# Patient Record
Sex: Male | Born: 1985 | Race: White | Hispanic: No | Marital: Married | Smoking: Light tobacco smoker
Health system: Southern US, Community
[De-identification: ages and names within clinical notes are randomized; demographics above are authoritative.]

## PROBLEM LIST (undated history)

## (undated) DIAGNOSIS — N433 Hydrocele, unspecified: Secondary | ICD-10-CM

## (undated) DIAGNOSIS — F909 Attention-deficit hyperactivity disorder, unspecified type: Secondary | ICD-10-CM

---
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
## 2016-01-31 ENCOUNTER — Ambulatory Visit (HOSPITAL_BASED_OUTPATIENT_CLINIC_OR_DEPARTMENT_OTHER)
Admission: RE | Admit: 2016-01-31 | Discharge: 2016-01-31 | Disposition: A | Discharge status: Home / Self Care | Payer: 59 | Source: Ambulatory Visit | Attending: Emergency Medicine | Admitting: Emergency Medicine

## 2016-01-31 ENCOUNTER — Emergency Department (HOSPITAL_BASED_OUTPATIENT_CLINIC_OR_DEPARTMENT_OTHER)
Admission: EM | Admit: 2016-01-31 | Discharge: 2016-01-31 | Disposition: A | Discharge status: Home / Self Care | Payer: 59 | Source: Home / Self Care | Attending: Emergency Medicine | Admitting: Emergency Medicine

## 2016-01-31 ENCOUNTER — Other Ambulatory Visit (HOSPITAL_BASED_OUTPATIENT_CLINIC_OR_DEPARTMENT_OTHER): Payer: Self-pay | Admitting: Emergency Medicine

## 2016-01-31 ENCOUNTER — Encounter (HOSPITAL_BASED_OUTPATIENT_CLINIC_OR_DEPARTMENT_OTHER): Payer: Self-pay | Admitting: *Deleted

## 2016-01-31 DIAGNOSIS — N50811 Right testicular pain: Secondary | ICD-10-CM

## 2016-01-31 DIAGNOSIS — Z79899 Other long term (current) drug therapy: Secondary | ICD-10-CM | POA: Diagnosis not present

## 2016-01-31 DIAGNOSIS — Z720 Tobacco use: Secondary | ICD-10-CM | POA: Insufficient documentation

## 2016-01-31 DIAGNOSIS — N451 Epididymitis: Secondary | ICD-10-CM | POA: Diagnosis not present

## 2016-01-31 HISTORY — DX: Hydrocele, unspecified: N43.3

## 2016-01-31 LAB — URINALYSIS, ROUTINE W REFLEX MICROSCOPIC
Bilirubin Urine: NEGATIVE
Glucose, UA: NEGATIVE mg/dL
Hgb urine dipstick: NEGATIVE
Ketones, ur: NEGATIVE mg/dL
Leukocytes, UA: NEGATIVE
Nitrite: NEGATIVE
Protein, ur: 100 mg/dL — AB
Specific Gravity, Urine: 1.025 (ref 1.005–1.030)
pH: 7.5 (ref 5.0–8.0)

## 2016-01-31 LAB — URINE MICROSCOPIC-ADD ON
RBC / HPF: NONE SEEN RBC/hpf (ref 0–5)
Squamous Epithelial / HPF: NONE SEEN
WBC, UA: NONE SEEN WBC/hpf (ref 0–5)

## 2016-01-31 MED ORDER — LEVOFLOXACIN 500 MG PO TABS: 500.0000 mg | ORAL_TABLET | Freq: Every day | ORAL | Status: DC

## 2016-01-31 NOTE — Discharge Instructions (Signed)
Epididymitis °Epididymitis is swelling (inflammation) of the epididymis. The epididymis is a cord-like structure that is located along the top and back part of the testicle. It collects and stores sperm from the testicle. °This condition can also cause pain and swelling of the testicle and scrotum. Symptoms usually start suddenly (acute epididymitis). Sometimes epididymitis starts gradually and lasts for a while (chronic epididymitis). This type may be harder to treat. °CAUSES °In men 35 and younger, this condition is usually caused by a bacterial infection or sexually transmitted disease (STD), such as: °· Gonorrhea. °· Chlamydia.   °In men 35 and older who do not have anal sex, this condition is usually caused by bacteria from a blockage or abnormalities in the urinary system. These can result from: °· Having a tube placed into the bladder (urinary catheter). °· Having an enlarged or inflamed prostate gland. °· Having recent urinary tract surgery. °In men who have a condition that weakens the body's defense system (immune system), such as HIV, this condition can be caused by:  °· Other bacteria, including tuberculosis and syphilis. °· Viruses. °· Fungi. °Sometimes this condition occurs without infection. That may happen if urine flows backward into the epididymis after heavy lifting or straining. °RISK FACTORS °This condition is more likely to develop in men: °· Who have unprotected sex with more than one partner. °· Who have anal sex.   °· Who have recently had surgery.   °· Who have a urinary catheter. °· Who have urinary problems. °· Who have a suppressed immune system.   °SYMPTOMS  °This condition usually begins suddenly with chills, fever, and pain behind the scrotum and in the testicle. Other symptoms include:  °· Swelling of the scrotum, testicle, or both. °· Pain when ejaculating or urinating. °· Pain in the back or belly. °· Nausea. °· Itching and discharge from the penis. °· Frequent need to pass  urine. °· Redness and tenderness of the scrotum. °DIAGNOSIS °Your health care provider can diagnose this condition based on your symptoms and medical history. Your health care provider will also do a physical exam to ask about your symptoms and check your scrotum and testicle for swelling, pain, and redness. You may also have other tests, including:   °· Examination of discharge from the penis. °· Urine tests for infections, such as STDs.   °Your health care provider may test you for other STDs, including HIV.  °TREATMENT °Treatment for this condition depends on the cause. If your condition is caused by a bacterial infection, oral antibiotic medicine may be prescribed. If the bacterial infection has spread to your blood, you may need to receive IV antibiotics. Nonbacterial epididymitis is treated with home care that includes bed rest and elevation of the scrotum. °Surgery may be needed to treat: °· Bacterial epididymitis that causes pus to build up in the scrotum (abscess). °· Chronic epididymitis that has not responded to other treatments. °HOME CARE INSTRUCTIONS °Medicines  °· Take over-the-counter and prescription medicines only as told by your health care provider.   °· If you were prescribed an antibiotic medicine, take it as told by your health care provider. Do not stop taking the antibiotic even if your condition improves. °Sexual Activity  °· If your epididymitis was caused by an STD, avoid sexual activity until your treatment is complete. °· Inform your sexual partner or partners if you test positive for an STD. They may need to be treated. Do not engage in sexual activity with your partner or partners until their treatment is completed. °General Instructions  °· Return to your normal activities as told   by your health care provider. Ask your health care provider what activities are safe for you.  Keep your scrotum elevated and supported while resting. Ask your health care provider if you should wear a  scrotal support, such as a jockstrap. Wear it as told by your health care provider.  If directed, apply ice to the affected area:   Put ice in a plastic bag.  Place a towel between your skin and the bag.  Leave the ice on for 20 minutes, 2-3 times per day.  Try taking a sitz bath to help with discomfort. This is a warm water bath that is taken while you are sitting down. The water should only come up to your hips and should cover your buttocks. Do this 3-4 times per day or as told by your health care provider.  Keep all follow-up visits as told by your health care provider. This is important. SEEK MEDICAL CARE IF:   You have a fever.   Your pain medicine is not helping.   Your pain is getting worse.   Your symptoms do not improve within three days.   This information is not intended to replace advice given to you by your health care provider. Make sure you discuss any questions you have with your health care provider.   Document Released: 07/29/2000 Document Revised: 04/22/2015 Document Reviewed: 12/17/2014 Elsevier Interactive Patient Education 2016 [employer]  Epididymitis Epididymitis is swelling (inflammation) of the epididymis. The epididymis is a cord-like structure that is located along the top and back part of the testicle. It collects and stores sperm from the testicle. This condition can also cause pain and swelling of the testicle and scrotum. Symptoms usually start suddenly (acute epididymitis). Sometimes epididymitis starts gradually and lasts for a while (chronic epididymitis). This type may be harder to treat. CAUSES In men 5635 and younger, this condition is usually caused by a bacterial infection or sexually transmitted disease (STD), such as:  Gonorrhea.  Chlamydia.  In men 4635 and older who do not have anal sex, this condition is usually caused by bacteria from a blockage or abnormalities in the urinary system. These can result from:  Having a tube  placed into the bladder (urinary catheter).  Having an enlarged or inflamed prostate gland.  Having recent urinary tract surgery. In men who have a condition that weakens the body's defense system (immune system), such as HIV, this condition can be caused by:   Other bacteria, including tuberculosis and syphilis.  Viruses.  Fungi. Sometimes this condition occurs without infection. That may happen if urine flows backward into the epididymis after heavy lifting or straining. RISK FACTORS This condition is more likely to develop in men:  Who have unprotected sex with more than one partner.  Who have anal sex.   Who have recently had surgery.   Who have a urinary catheter.  Who have urinary problems.  Who have a suppressed immune system. SYMPTOMS  This condition usually begins suddenly with chills, fever, and pain behind the scrotum and in the testicle. Other symptoms include:   Swelling of the scrotum, testicle, or both.  Pain whenejaculatingor urinating.  Pain in the back or belly.  Nausea.  Itching and discharge from the penis.  Frequent need to pass urine.  Redness and tenderness of the scrotum. DIAGNOSIS Your health care provider can diagnose this condition based on your symptoms and medical history. Your health care provider will also do a physical exam to ask about your symptoms and  check your scrotum and testicle for swelling, pain, and redness. You may also have other tests, including:   Examination of discharge from the penis.  Urine tests for infections, such as STDs.  Your health care provider may test you for other STDs, including HIV. TREATMENT Treatment for this condition depends on the cause. If your condition is caused by a bacterial infection, oral antibiotic medicine may be prescribed. If the bacterial infection has spread to your blood, you may need to receive IV antibiotics. Nonbacterial epididymitis is treated with home care that includes  bed rest and elevation of the scrotum. Surgery may be needed to treat:  Bacterial epididymitis that causes pus to build up in the scrotum (abscess).  Chronic epididymitis that has not responded to other treatments. HOME CARE INSTRUCTIONS Medicines  Take over-the-counter and prescription medicines only as told by your health care provider.   If you were prescribed an antibiotic medicine, take it as told by your health care provider. Do not stop taking the antibiotic even if your condition improves. Sexual Activity  If your epididymitis was caused by an STD, avoid sexual activity until your treatment is complete.  Inform your sexual partner or partners if you test positive for an STD. They may need to be treated.Do not engage in sexual activity with your partner or partners until their treatment is completed. General Instructions  Return to your normal activities as told by your health care provider. Ask your health care provider what activities are safe for you.  Keep your scrotum elevated and supported while resting. Ask your health care provider if you should wear a scrotal support, such as a jockstrap. Wear it as told by your health care provider.  If directed, apply ice to the affected area:   Put ice in a plastic bag.  Place a towel between your skin and the bag.  Leave the ice on for 20 minutes, 2-3 times per day.  Try taking a sitz bath to help with discomfort. This is a warm water bath that is taken while you are sitting down. The water should only come up to your hips and should cover your buttocks. Do this 3-4 times per day or as told by your health care provider.  Keep all follow-up visits as told by your health care provider. This is important. SEEK MEDICAL CARE IF:   You have a fever.   Your pain medicine is not helping.   Your pain is getting worse.   Your symptoms do not improve within three days.   This information is not intended to replace advice  given to you by your health care provider. Make sure you discuss any questions you have with your health care provider.   Document Released: 07/29/2000 Document Revised: 04/22/2015 Document Reviewed: 12/17/2014 Elsevier Interactive Patient Education [employer]

## 2016-01-31 NOTE — ED Notes (Signed)
MD with pt  

## 2016-01-31 NOTE — ED Notes (Signed)
Pt c/o aching pain to right testicle times one week. States pain comes and goes. States that he feels like his right testicle is more swollen. States he does heavy lifting at work. Denies any fevers. Denies any urinary frequency or penile d/c.

## 2016-01-31 NOTE — ED Notes (Signed)
Report given to next shift.

## 2016-01-31 NOTE — ED Provider Notes (Signed)
CSN: [id number]     Arrival date & time 01/31/16  [id number] History   First MD Initiated Contact with Patient 01/31/16 1002     Chief Complaint  Patient presents with  . Testicle Pain     (Consider location/radiation/quality/duration/timing/severity/associated sxs/prior Treatment) HPI Comments: Patient presents with pain to his right testicle. He states it started about one week ago. It waxes and wanes in intensity but overall he feels like it's better. He feels like it's a little bit more swollen than normal. He denies any abdominal pain. No nausea or vomiting. No urinary symptoms. He is sexually active with his wife. He denies any increased risk for STDs. He currently is using condoms. He has a prior history of a hydrocele repair as a child.   Past Medical History  Diagnosis Date  . Hydrocele    History reviewed. No pertinent past surgical history. No family history on file. Social History  Substance Use Topics  . Smoking status: Light Tobacco Smoker  . Smokeless tobacco: None  . Alcohol Use: Yes     Comment: occasional     Review of Systems  Constitutional: Negative for fever, chills, diaphoresis and fatigue.  HENT: Negative for congestion, rhinorrhea and sneezing.   Eyes: Negative.   Respiratory: Negative for cough, chest tightness and shortness of breath.   Cardiovascular: Negative for chest pain and leg swelling.  Gastrointestinal: Negative for nausea, vomiting, abdominal pain, diarrhea and blood in stool.  Genitourinary: Positive for scrotal swelling and testicular pain. Negative for frequency, hematuria, flank pain, discharge, difficulty urinating and penile pain.  Musculoskeletal: Negative for back pain and arthralgias.  Skin: Negative for rash.  Neurological: Negative for dizziness, speech difficulty, weakness, numbness and headaches.      Allergies  Amoxicillin; Ceclor; and Pertussis vaccines  Home Medications   Prior to Admission medications   Medication Sig  Start Date End Date Taking? Authorizing Provider  amphetamine-dextroamphetamine (ADDERALL) 30 MG tablet Take by mouth daily. 60mg  daily   Yes Historical Provider, MD  levofloxacin (LEVAQUIN) 500 MG tablet Take 1 tablet (500 mg total) by mouth daily. 01/31/16   Rolan BuccoMelanie Natash Berman, MD   BP 127/79 mmHg  Pulse 108  Resp 18  Ht 5\' 10"  (1.778 m)  Wt 165 lb (74.844 kg)  BMI 23.68 kg/m2  SpO2 100% Physical Exam  Constitutional: He is oriented to person, place, and time. He appears well-developed and well-nourished.  HENT:  Head: Normocephalic and atraumatic.  Eyes: Pupils are equal, round, and reactive to light.  Neck: Normal range of motion. Neck supple.  Cardiovascular: Normal rate, regular rhythm and normal heart sounds.   Pulmonary/Chest: Effort normal and breath sounds normal. No respiratory distress. He has no wheezes. He has no rales. He exhibits no tenderness.  Abdominal: Soft. Bowel sounds are normal. There is no tenderness. There is no rebound and no guarding.  Genitourinary: Penis normal.  Mild tenderness to his right testicle. No pain to the left testicle. No abnormal lie.  No palpable hernia.  Musculoskeletal: Normal range of motion. He exhibits no edema.  Lymphadenopathy:    He has no cervical adenopathy.  Neurological: He is alert and oriented to person, place, and time.  Skin: Skin is warm and dry. No rash noted.  Psychiatric: He has a normal mood and affect.    ED Course  Procedures (including critical care time) Labs Review Labs Reviewed  URINALYSIS, ROUTINE W REFLEX MICROSCOPIC ([facility]) - Abnormal; Notable for the following:    APPearance TURBID (*)  Protein, ur 100 (*)    All other components within normal limits  URINE MICROSCOPIC-ADD ON - Abnormal; Notable for the following:    Bacteria, UA MANY (*)    All other components within normal limits  GC/CHLAMYDIA PROBE AMP ([place]) NOT AT [facility]    Imaging Review No results found. I have personally reviewed  and evaluated these images and lab results as part of my medical decision-making.   EKG Interpretation None      MDM   Final diagnoses:  Epididymitis    Patient has an allergy to amoxicillin and cephalosporins. He was started on Levaquin as he seems to be low risk for STDs. However GC and Chlamydia is pending. Ultrasound will be here in 2 hours. A testicle ultrasound was ordered for this afternoon and he will return to have the ultrasound. Given his symptoms have been going on for 5 days I feel it's appropriate to wait until this afternoon to get the ultrasound.    Rolan Bucco, MD 01/31/16 1125

## 2016-02-01 LAB — GC/CHLAMYDIA PROBE AMP (~~LOC~~) NOT AT ARMC
Chlamydia: NEGATIVE
Neisseria Gonorrhea: NEGATIVE

## 2017-12-08 ENCOUNTER — Emergency Department (HOSPITAL_BASED_OUTPATIENT_CLINIC_OR_DEPARTMENT_OTHER)
Admission: EM | Admit: 2017-12-08 | Discharge: 2017-12-08 | Disposition: A | Discharge status: Home / Self Care | Payer: 59 | Attending: Emergency Medicine | Admitting: Emergency Medicine

## 2017-12-08 ENCOUNTER — Other Ambulatory Visit: Payer: Self-pay

## 2017-12-08 ENCOUNTER — Emergency Department (HOSPITAL_BASED_OUTPATIENT_CLINIC_OR_DEPARTMENT_OTHER): Payer: 59

## 2017-12-08 ENCOUNTER — Encounter (HOSPITAL_BASED_OUTPATIENT_CLINIC_OR_DEPARTMENT_OTHER): Payer: Self-pay | Admitting: Emergency Medicine

## 2017-12-08 DIAGNOSIS — N50811 Right testicular pain: Secondary | ICD-10-CM | POA: Diagnosis present

## 2017-12-08 DIAGNOSIS — F17200 Nicotine dependence, unspecified, uncomplicated: Secondary | ICD-10-CM | POA: Diagnosis not present

## 2017-12-08 DIAGNOSIS — N452 Orchitis: Secondary | ICD-10-CM | POA: Insufficient documentation

## 2017-12-08 DIAGNOSIS — K4090 Unilateral inguinal hernia, without obstruction or gangrene, not specified as recurrent: Secondary | ICD-10-CM | POA: Diagnosis not present

## 2017-12-08 HISTORY — DX: Attention-deficit hyperactivity disorder, unspecified type: F90.9

## 2017-12-08 LAB — COMPREHENSIVE METABOLIC PANEL
ALBUMIN: 4.4 g/dL (ref 3.5–5.0)
ALK PHOS: 54 U/L (ref 38–126)
ALT: 18 U/L (ref 17–63)
AST: 18 U/L (ref 15–41)
Anion gap: 10 (ref 5–15)
BUN: 13 mg/dL (ref 6–20)
CALCIUM: 8.7 mg/dL — AB (ref 8.9–10.3)
CO2: 23 mmol/L (ref 22–32)
CREATININE: 0.85 mg/dL (ref 0.61–1.24)
Chloride: 103 mmol/L (ref 101–111)
GFR calc Af Amer: >60 mL/min (ref >60)
GFR calc non Af Amer: >60 mL/min (ref >60)
GLUCOSE: 109 mg/dL — AB (ref 65–99)
Potassium: 3.9 mmol/L (ref 3.5–5.1)
Sodium: 136 mmol/L (ref 135–145)
Total Bilirubin: 0.5 mg/dL (ref 0.3–1.2)
Total Protein: 7.1 g/dL (ref 6.5–8.1)

## 2017-12-08 LAB — URINALYSIS, ROUTINE W REFLEX MICROSCOPIC
BILIRUBIN URINE: NEGATIVE
Glucose, UA: NEGATIVE mg/dL
HGB URINE DIPSTICK: NEGATIVE
KETONES UR: NEGATIVE mg/dL
Leukocytes, UA: NEGATIVE
Nitrite: NEGATIVE
PH: 6.5 (ref 5.0–8.0)
Protein, ur: NEGATIVE mg/dL
Specific Gravity, Urine: <1.005 — ABNORMAL LOW (ref 1.005–1.030)

## 2017-12-08 LAB — CBC WITH DIFFERENTIAL/PLATELET
BASOS PCT: 1 %
Basophils Absolute: 0.1 10*3/uL (ref 0.0–0.1)
EOS ABS: 0.1 10*3/uL (ref 0.0–0.7)
Eosinophils Relative: 2 %
HCT: 42.4 % (ref 39.0–52.0)
Hemoglobin: 15.6 g/dL (ref 13.0–17.0)
LYMPHS ABS: 1.9 10*3/uL (ref 0.7–4.0)
Lymphocytes Relative: 30 %
MCH: 33.3 pg (ref 26.0–34.0)
MCHC: 36.8 g/dL — AB (ref 30.0–36.0)
MCV: 90.4 fL (ref 78.0–100.0)
Monocytes Absolute: 0.6 10*3/uL (ref 0.1–1.0)
Monocytes Relative: 9 %
NEUTROS ABS: 3.5 10*3/uL (ref 1.7–7.7)
Neutrophils Relative %: 58 %
Platelets: 242 10*3/uL (ref 150–400)
RBC: 4.69 MIL/uL (ref 4.22–5.81)
RDW: 11.8 % (ref 11.5–15.5)
WBC: 6.2 10*3/uL (ref 4.0–10.5)

## 2017-12-08 MED ORDER — IOPAMIDOL (ISOVUE-300) INJECTION 61%
100.0000 mL | Freq: Once | INTRAVENOUS | Status: AC | PRN
  Administered 2017-12-08: 100 mL via INTRAVENOUS

## 2017-12-08 MED ORDER — ONDANSETRON 4 MG PO TBDP: 4.0000 mg | ORAL_TABLET | Freq: Three times a day (TID) | ORAL | 0 refills | Status: AC | PRN

## 2017-12-08 MED ORDER — KETOROLAC TROMETHAMINE 30 MG/ML IJ SOLN
30.0000 mg | Freq: Once | INTRAMUSCULAR | Status: AC
  Administered 2017-12-08: 30 mg via INTRAVENOUS
  Filled 2017-12-08: qty 1

## 2017-12-08 MED ORDER — TRAMADOL HCL 50 MG PO TABS: 50.0000 mg | ORAL_TABLET | Freq: Four times a day (QID) | ORAL | 0 refills | Status: AC | PRN

## 2017-12-08 MED ORDER — SULFAMETHOXAZOLE-TRIMETHOPRIM 800-160 MG PO TABS: 1.0000 | ORAL_TABLET | Freq: Two times a day (BID) | ORAL | 0 refills | Status: AC

## 2017-12-08 MED ORDER — IBUPROFEN 800 MG PO TABS: 800.0000 mg | ORAL_TABLET | Freq: Three times a day (TID) | ORAL | 0 refills | Status: AC | PRN

## 2017-12-08 NOTE — ED Notes (Signed)
ED Provider at bedside. 

## 2017-12-08 NOTE — Discharge Instructions (Signed)
You were seen in the ED today with right inguinal and testicle pain. We did find a small hernia on the right. No bowel is involved but read the instructions provided. You also had inflammation of the right testicle. We are treating you with antibiotics. Call the surgeon on Monday to schedule and return with any new or worsening symptoms.

## 2017-12-08 NOTE — ED Provider Notes (Signed)
Emergency Department Provider Note   I have reviewed the triage vital signs and the nursing notes.   HISTORY  Chief Complaint Testicle Pain   HPI Hunter Russell is a 32 y.o. male with PMH of hydrocele presents to the emergency department for evaluation of right testicle pain and swelling.  The patient states that symptoms began last night and have gradually worsened throughout the day.  He denies specific "pain" but describes it as pressure.  He denies any sudden onset pressure from lifting or straining.  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  He denies any nausea, vomiting, constipation.  He continues to pass flatus.   Past Medical History:  Diagnosis Date  . ADHD   . Hydrocele     There are no active problems to display for this patient.   History reviewed. No pertinent surgical history.  Current Outpatient Rx  . Order #: [id number] Class: Historical Med  . Order #: [id number] Class: Print  . Order #: [id number] Class: Print  . Order #: [id number] Class: Print  . Order #: [id number] Class: Print    Allergies Amoxicillin; Ceclor [cefaclor]; and Pertussis vaccines  No family history on file.  Social History Social History   Tobacco Use  . Smoking status: Light Tobacco Smoker  Substance Use Topics  . Alcohol use: Yes    Comment: occasional   . Drug use: No    Review of Systems  Constitutional: No fever/chills Eyes: No visual changes. ENT: No sore throat. Cardiovascular: Denies chest pain. Respiratory: Denies shortness of breath. Gastrointestinal: Positive RLQ abdominal pain.  No nausea, no vomiting.  No diarrhea.  No constipation. Genitourinary: Negative for dysuria. Positive right testicle pain and swelling.  Musculoskeletal: Negative for back pain. Skin: Negative for rash. Neurological: Negative for headaches, focal weakness or numbness.  10-point ROS  otherwise negative.  ____________________________________________   PHYSICAL EXAM:  VITAL SIGNS: ED Triage Vitals  Enc Vitals Group     BP 12/08/17 2038 (!) 147/83     Pulse Rate 12/08/17 2038 (!) 104     Resp 12/08/17 2038 16     Temp 12/08/17 2038 98.7 F (37.1 C)     Temp Source 12/08/17 2038 Oral     SpO2 12/08/17 2038 100 %     Weight 12/08/17 2035 200 lb (90.7 kg)     Height 12/08/17 2035 5\' 10"  (1.778 m)     Pain Score 12/08/17 2035 4   Constitutional: Alert and oriented. Well appearing and in no acute distress. Eyes: Conjunctivae are normal.  Head: Atraumatic. Nose: No congestion/rhinnorhea. Mouth/Throat: Mucous membranes are moist.  Neck: No stridor.   Cardiovascular: Normal rate, regular rhythm. Good peripheral circulation. Grossly normal heart sounds.   Respiratory: Normal respiratory effort.  No retractions. Lungs CTAB. Gastrointestinal: Soft and nontender. Point tenderness along with right inguinal canal with small mass. No distention.  Genitourinary: Right testicle tenderness to palpation with mild swelling. No appreciable large hernia in the scrotum.  Musculoskeletal: No lower extremity tenderness nor edema. No gross deformities of extremities. Neurologic:  Normal speech and language. No gross focal neurologic deficits are appreciated.  Skin:  Skin is warm, dry and intact. No rash noted.  ____________________________________________   LABS (all labs ordered are listed, but only abnormal results are displayed)  Labs Reviewed  COMPREHENSIVE METABOLIC PANEL - Abnormal; Notable for the following components:      Result Value  Glucose, Bld 109 (*)    Calcium 8.7 (*)    All other components within normal limits  CBC WITH DIFFERENTIAL/PLATELET - Abnormal; Notable for the following components:   MCHC 36.8 (*)    All other components within normal limits  URINALYSIS, ROUTINE W REFLEX MICROSCOPIC - Abnormal; Notable for the following components:   Specific  Gravity, Urine <1.005 (*)    All other components within normal limits  URINE CULTURE   ____________________________________________  RADIOLOGY  Ct Abdomen Pelvis W Contrast  Result Date: 12/08/2017 CLINICAL DATA:  Initial evaluation for complicated hernia, right testicular swelling. EXAM: CT ABDOMEN AND PELVIS WITH CONTRAST TECHNIQUE: Multidetector CT imaging of the abdomen and pelvis was performed using the standard protocol following bolus administration of intravenous contrast. CONTRAST:  ISOVUE-300 IOPAMIDOL (ISOVUE-300) INJECTION 61% COMPARISON:  Prior ultrasound from 01/31/2016. FINDINGS: Lower chest: Minimal subsegmental atelectatic changes seen dependently within the right lung base. Visualized lungs are otherwise clear. Hepatobiliary: Liver demonstrates a normal contrast enhanced appearance. Gallbladder largely contracted without acute abnormality. No biliary dilatation. Pancreas: Pancreas within normal limits. Spleen: Normal. Adrenals/Urinary Tract: Adrenal glands are normal. Kidneys equal size with symmetric enhancement. No nephrolithiasis, hydronephrosis, or focal enhancing renal mass. No hydroureter. Partially distended bladder within normal limits. Stomach/Bowel: Stomach within normal limits. No evidence for bowel obstruction. Normal appendix. No acute inflammatory changes seen about the bowels. Vascular/Lymphatic: Normal intravascular enhancement seen throughout the intra-abdominal aorta. Mesenteric vessels patent proximally. No adenopathy. Reproductive: Prostate normal. Other: Fat containing right inguinal hernia is present within the right inguinal region (series 2, image 83). Hazy soft tissue stranding within the herniated fat suggesting acute inflammation. No herniated loops of bowel or other complication. Additional small fat containing left inguinal hernia noted as well without associated inflammation. No free air or fluid. Musculoskeletal: No acute osseus abnormality. No  worrisome lytic or blastic osseous lesions. IMPRESSION: 1. Fat containing right inguinal hernia with associated mild inflammatory fat stranding, presumably reflecting patient's source of pain/discomfort. No herniated loops of bowel or other complication identified. 2. No other acute intra-abdominal or pelvic process. Electronically Signed   By: Rise Mu M.D.   On: 12/08/2017 23:03   US Scrotum W/doppler  Result Date: 12/08/2017 CLINICAL DATA:  Initial evaluation for right testicular swelling and discomfort for 2 days. EXAM: SCROTAL ULTRASOUND DOPPLER ULTRASOUND OF THE TESTICLES TECHNIQUE: Complete ultrasound examination of the testicles, epididymis, and other scrotal structures was performed. Color and spectral Doppler ultrasound were also utilized to evaluate blood flow to the testicles. COMPARISON:  None. FINDINGS: Right testicle Measurements: 4.6 x 2.5 x 3.2 cm. No mass or microlithiasis visualized. Right testicle is hypervascular as compared to the left. Left testicle Measurements: 4.5 x 2.3 x 2.8 cm. No mass or microlithiasis visualized. Right epididymis:  Normal in size and appearance. Left epididymis:  Normal in size and appearance. Hydrocele:  None visualized. Varicocele:  None visualized. Pulsed Doppler interrogation of both testes demonstrates normal low resistance arterial and venous waveforms bilaterally. IMPRESSION: 1. Increased vascularity within the right testicle is compared to the left, suggesting possible acute orchitis. No intra testicular abscess or other complication. 2. Otherwise normal scrotal ultrasound. Electronically Signed   By: Rise Mu M.D.   On: 12/08/2017 22:41    ____________________________________________   PROCEDURES  Procedure(s) performed:   Procedures  None ____________________________________________   INITIAL IMPRESSION / ASSESSMENT AND PLAN / ED COURSE  Pertinent labs & imaging results that were available during my care of the  patient were reviewed by  me and considered in my medical decision making (see chart for details).  Patient presents to the emergency department for evaluation of right testicle pain and swelling.  He does have a history of hydrocele.  He went to urgent care and was referred to the emergency department for further testing.  Lower suspicion for torsion but will obtain ultrasound to confirm and rule out hydrocele.  I did also obtain CT abdomen pelvis to evaluate for possible hernia.  Toradol for pain and reassess.   Patient feeling better after Toradol. CT shows a small fat-containing hernia without bowel involvement or incarcerated bowel. Mild pain symptoms. Patient also with orchitis on [place]. Labs reviewed with no acute findings. Plan for Bactrim for orchitis and surgery referral with pain meds for fat-containing hernia.   At this time, I do not feel there is any life-threatening condition present. I have reviewed and discussed all results (EKG, imaging, lab, urine as appropriate), exam findings with patient. I have reviewed nursing notes and appropriate previous records.  I feel the patient is safe to be discharged home without further emergent workup. Discussed usual and customary return precautions. Patient and family (if present) verbalize understanding and are comfortable with this plan.  Patient will follow-up with their primary care provider. If they do not have a primary care provider, information for follow-up has been provided to them. All questions have been answered.  ____________________________________________  FINAL CLINICAL IMPRESSION(S) / ED DIAGNOSES  Final diagnoses:  Non-recurrent unilateral inguinal hernia without obstruction or gangrene  Orchitis, right     MEDICATIONS GIVEN DURING THIS VISIT:  Medications  ketorolac (TORADOL) 30 MG/ML injection 30 mg (30 mg Intravenous Given 12/08/17 2101)  iopamidol (ISOVUE-300) 61 % injection 100 mL (100 mLs Intravenous Contrast Given  12/08/17 2235)     NEW OUTPATIENT MEDICATIONS STARTED DURING THIS VISIT:  Discharge Medication List as of 12/08/2017 11:18 PM    START taking these medications   Details  ibuprofen (ADVIL,MOTRIN) 800 MG tablet Take 1 tablet (800 mg total) by mouth every 8 (eight) hours as needed., Starting Fri 12/08/2017, Print    ondansetron (ZOFRAN ODT) 4 MG disintegrating tablet Take 1 tablet (4 mg total) by mouth every 8 (eight) hours as needed for nausea or vomiting., Starting Fri 12/08/2017, Print    sulfamethoxazole-trimethoprim (BACTRIM DS,SEPTRA DS) 800-160 MG tablet Take 1 tablet by mouth 2 (two) times daily for 10 days., Starting Fri 12/08/2017, Until Mon 12/18/2017, Print    traMADol (ULTRAM) 50 MG tablet Take 1 tablet (50 mg total) by mouth every 6 (six) hours as needed., Starting Fri 12/08/2017, Print        Note:  This document was prepared using Dragon voice recognition software and may include unintentional dictation errors.  Alona Bene, MD Emergency Medicine    Long, Arlyss Repress, MD 12/09/17 [phone]

## 2017-12-08 NOTE — ED Notes (Signed)
Patient transported to Ultrasound 

## 2017-12-08 NOTE — ED Triage Notes (Signed)
Pt c/o right testicle swelling since last night. Pt was seen at [facility] earlier and told he had testicular hernia, no follow up was given.

## 2017-12-08 NOTE — ED Notes (Signed)
Patient transported to CT 

## 2017-12-10 LAB — URINE CULTURE
Culture: NO GROWTH
SPECIAL REQUESTS: NORMAL

## 2017-12-14 ENCOUNTER — Emergency Department (HOSPITAL_BASED_OUTPATIENT_CLINIC_OR_DEPARTMENT_OTHER)
Admission: EM | Admit: 2017-12-14 | Discharge: 2017-12-15 | Disposition: A | Discharge status: Home / Self Care | Payer: No Typology Code available for payment source | Attending: Emergency Medicine | Admitting: Emergency Medicine

## 2017-12-14 ENCOUNTER — Other Ambulatory Visit: Payer: Self-pay

## 2017-12-14 ENCOUNTER — Encounter (HOSPITAL_BASED_OUTPATIENT_CLINIC_OR_DEPARTMENT_OTHER): Payer: Self-pay | Admitting: Adult Health

## 2017-12-14 DIAGNOSIS — Z23 Encounter for immunization: Secondary | ICD-10-CM | POA: Insufficient documentation

## 2017-12-14 DIAGNOSIS — S61211A Laceration without foreign body of left index finger without damage to nail, initial encounter: Secondary | ICD-10-CM | POA: Diagnosis present

## 2017-12-14 DIAGNOSIS — Y990 Civilian activity done for income or pay: Secondary | ICD-10-CM | POA: Diagnosis not present

## 2017-12-14 DIAGNOSIS — Y9389 Activity, other specified: Secondary | ICD-10-CM | POA: Insufficient documentation

## 2017-12-14 DIAGNOSIS — Y9289 Other specified places as the place of occurrence of the external cause: Secondary | ICD-10-CM | POA: Insufficient documentation

## 2017-12-14 DIAGNOSIS — Z720 Tobacco use: Secondary | ICD-10-CM | POA: Diagnosis not present

## 2017-12-14 DIAGNOSIS — Z79899 Other long term (current) drug therapy: Secondary | ICD-10-CM | POA: Diagnosis not present

## 2017-12-14 DIAGNOSIS — W228XXA Striking against or struck by other objects, initial encounter: Secondary | ICD-10-CM | POA: Insufficient documentation

## 2017-12-14 NOTE — ED Triage Notes (Signed)
Presents with laceration to left index finger that occurred at 5:45 this evening at work. The finger was cut by a metal band. BLeeding is controlled. FInger is cut at knuckle.

## 2017-12-15 MED ORDER — TETANUS-DIPHTHERIA TOXOIDS TD 5-2 LFU IM INJ
0.5000 mL | INJECTION | Freq: Once | INTRAMUSCULAR | Status: AC
  Administered 2017-12-15: 0.5 mL via INTRAMUSCULAR
  Filled 2017-12-15: qty 0.5

## 2017-12-15 MED ORDER — LIDOCAINE HCL 2 % IJ SOLN
10.0000 mL | Freq: Once | INTRAMUSCULAR | Status: AC
  Administered 2017-12-15: 200 mg via INTRADERMAL
  Filled 2017-12-15: qty 20

## 2017-12-15 MED ORDER — TETANUS-DIPHTH-ACELL PERTUSSIS 5-2.5-18.5 LF-MCG/0.5 IM SUSP
INTRAMUSCULAR | Status: AC
  Filled 2017-12-15: qty 0.5

## 2017-12-15 NOTE — ED Provider Notes (Addendum)
[facility] Provider Note: Hunter Dell, MD, FACEP  CSN: [id number] MRN: [id number] ARRIVAL: 12/14/17 at 2023 ROOM: [facility]   CHIEF COMPLAINT  Laceration   HISTORY OF PRESENT ILLNESS  12/15/17 12:08 AM Hunter Russell is a 32 y.o. male who was at work about 545 yesterday afternoon when a metal strap broke and cut his left index finger.  He has a laceration over the left index finger PIP joint dorsally.  He has no functional or sensory deficit distally.  His tetanus is not up-to-date.  There was significant bleeding after the accident but bleeding has now been controlled with pressure.   Past Medical History:  Diagnosis Date  . ADHD   . Hydrocele     History reviewed. No pertinent surgical history.  History reviewed. No pertinent family history.  Social History   Tobacco Use  . Smoking status: Light Tobacco Smoker  Substance Use Topics  . Alcohol use: Yes    Comment: occasional   . Drug use: No    Prior to Admission medications   Medication Sig Start Date End Date Taking? Authorizing Provider  amphetamine-dextroamphetamine (ADDERALL) 30 MG tablet Take by mouth daily.  daily    [provider]  ibuprofen (ADVIL,MOTRIN) 800 MG tablet Take 1 tablet (800 mg total) by mouth every 8 (eight) hours as needed. 12/08/17   Long, Arlyss Repress, MD  ondansetron (ZOFRAN ODT) 4 MG disintegrating tablet Take 1 tablet (4 mg total) by mouth every 8 (eight) hours as needed for nausea or vomiting. 12/08/17   Long, Arlyss Repress, MD  sulfamethoxazole-trimethoprim (BACTRIM DS,SEPTRA DS) 800-160 MG tablet Take 1 tablet by mouth 2 (two) times daily for 10 days. 12/08/17 12/18/17  Long, Arlyss Repress, MD  traMADol (ULTRAM) 50 MG tablet Take 1 tablet (50 mg total) by mouth every 6 (six) hours as needed. 12/08/17   Long, Arlyss Repress, MD    Allergies Amoxicillin; Ceclor [cefaclor]; and Pertussis vaccines   REVIEW OF SYSTEMS  Negative except as noted here or in the History of Present  Illness.   PHYSICAL EXAMINATION  Initial Vital Signs Blood pressure 118/84, pulse 96, temperature 98.3 F (36.8 C), temperature source Oral, resp. rate 18, SpO2 100 %.  Examination General: Well-developed, well-nourished male in no acute distress; appearance consistent with age of record HENT: normocephalic; atraumatic Eyes: Normal appearance Neck: supple Heart: regular rate and rhythm Lungs: clear to auscultation bilaterally Abdomen: soft; nondistended present Extremities: No deformity; full range of motion; pulses normal; laceration over left index finger PIP joint dorsally without tendon deficit, index finger distally neurovascularly intact Neurologic: Awake, alert and oriented; motor function intact in all extremities and symmetric; no facial droop Skin: Warm and dry Psychiatric: Normal mood and affect   RESULTS  Summary of this visit's results, reviewed by myself:   EKG Interpretation  Date/Time:    Ventricular Rate:    PR Interval:    QRS Duration:   QT Interval:    QTC Calculation:   R Axis:     Text Interpretation:        Laboratory Studies: No results found for this or any previous visit (from the past 24 hour(s)). Imaging Studies: No results found.  ED COURSE and MDM  Nursing notes and initial vitals signs, including pulse oximetry, reviewed.  Vitals:   12/14/17 2048 12/14/17 2240  BP: 128/87 118/84  Pulse: (!) 110 96  Resp: 18 18  Temp: 98.3 F (36.8 C)   TempSrc: Oral   SpO2: 97%  100%   Wound is fairly shallow and I do not believe antibiotics are indicated at this time.  PROCEDURES   LACERATION REPAIR Performed by: Hanley Seamen Authorized by: Hanley Seamen Consent: Verbal consent obtained. Risks and benefits: risks, benefits and alternatives were discussed Consent given by: patient Patient identity confirmed: provided demographic data Prepped and Draped in normal sterile fashion Wound explored  Laceration Location: Left index  finger  Laceration Length: 1.2 cm  No Foreign Bodies seen or palpated  Anesthesia: local infiltration  Local anesthetic: lidocaine 2 % without epinephrine  Anesthetic total: 2 ml  Irrigation method: syringe Amount of cleaning: standard  Skin closure: 4-0 Prolene  Number of sutures: 3  Technique: Supple interrupted  Patient tolerance: Patient tolerated the procedure well with no immediate complications.   ED DIAGNOSES     ICD-10-CM   1. Laceration of left index finger without foreign body without damage to nail, initial encounter Z61.096E        [name], MD 12/15/17 0031    [name], MD 12/15/17 [phone]

## 2017-12-15 NOTE — ED Notes (Signed)
ED Provider at bedside. 

## 2018-12-14 IMAGING — CT CT ABD-PELV W/ CM
2 of 4 series · 16 of 46 positions shown, 18 images · IV contrast (APPLIED)
Comparison: Prior ultrasound from 01/31/2016.

CLINICAL DATA: Initial evaluation for complicated hernia, right
testicular swelling.

EXAM:
CT ABDOMEN AND PELVIS WITH CONTRAST
TECHNIQUE: Multidetector CT imaging of the abdomen and pelvis was performed
using the standard protocol following bolus administration of
intravenous contrast.
CONTRAST:  100mL [id number] IOPAMIDOL ([id number]) INJECTION 61%

[Series 2: axial st · axial · 0.76mm/px · z∈[-496,-50]mm · 13 of 99 slices shown, 15 images]
[im 5/99  soft-tissue]
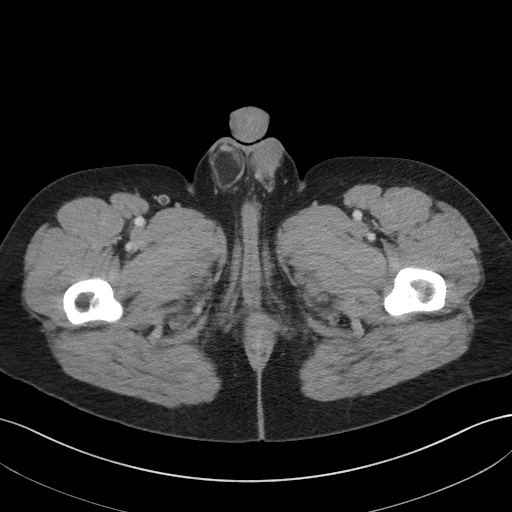
[im 5/99  bone]
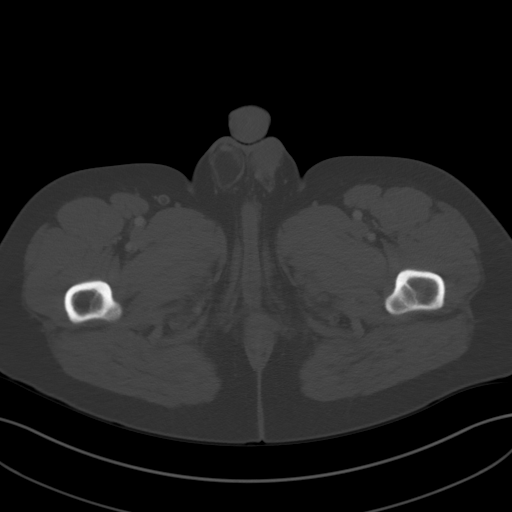
[im 13/99  soft-tissue]
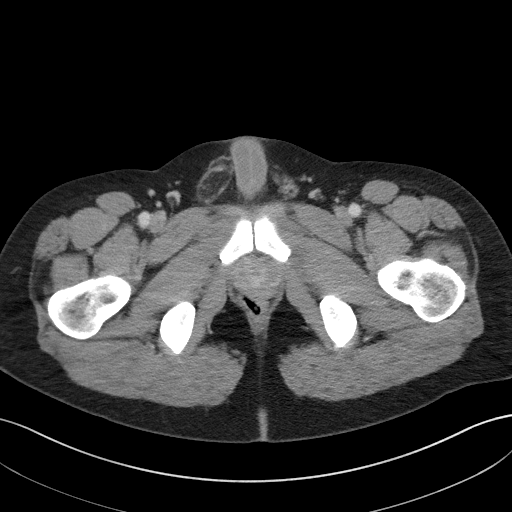
[im 21/99  soft-tissue]
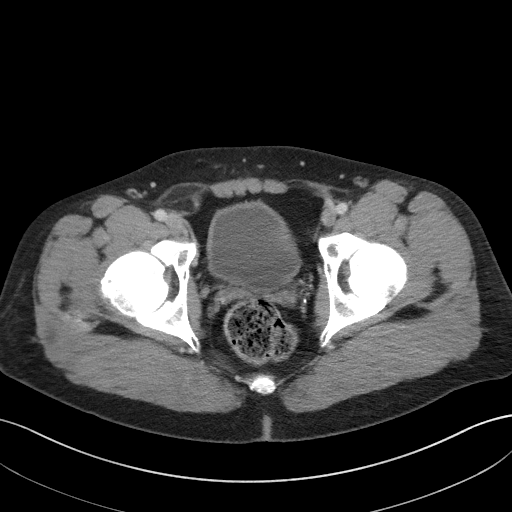
[im 29/99  soft-tissue]
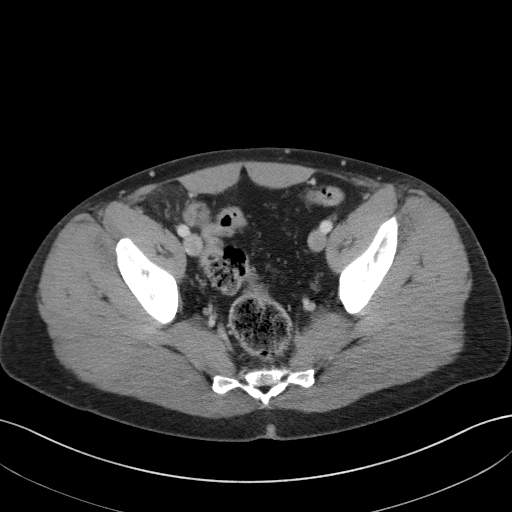
[im 33/99  soft-tissue]
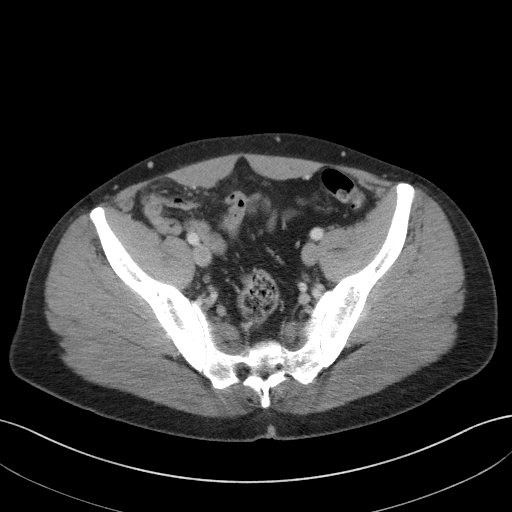
[im 41/99  soft-tissue]
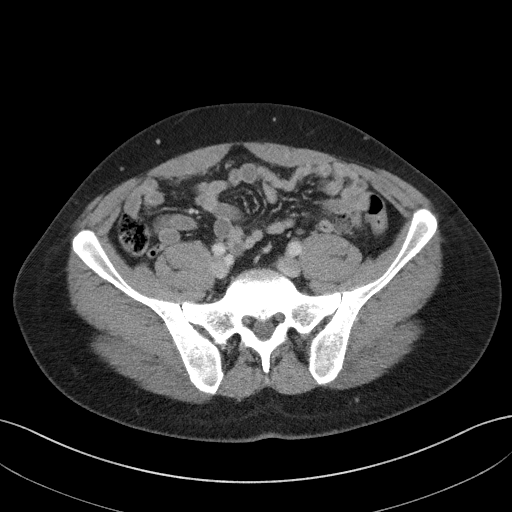
[im 50/99  soft-tissue]
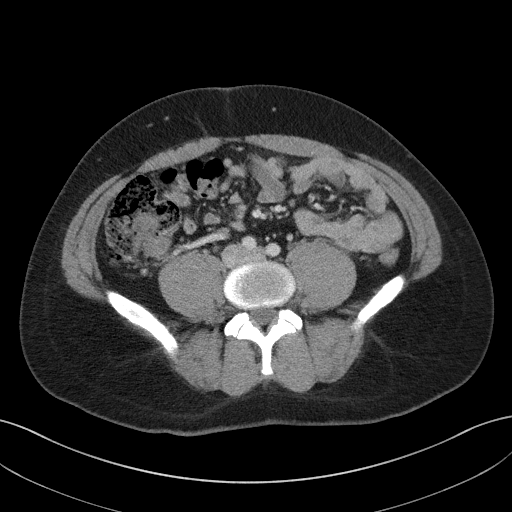
[im 58/99  soft-tissue]
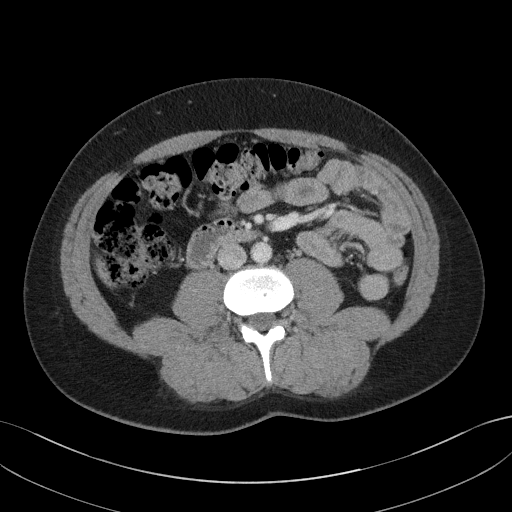
[im 66/99  soft-tissue]
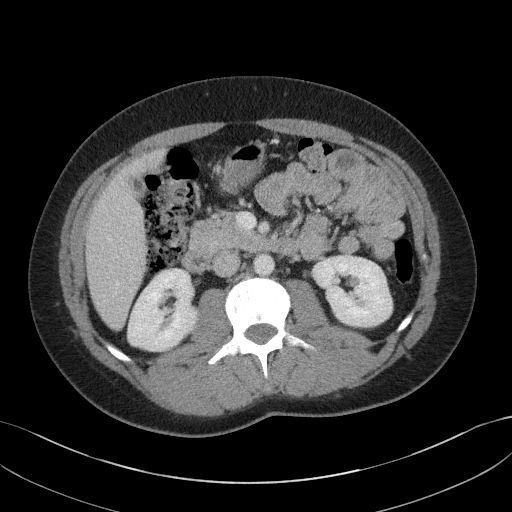
[im 66/99  bone]
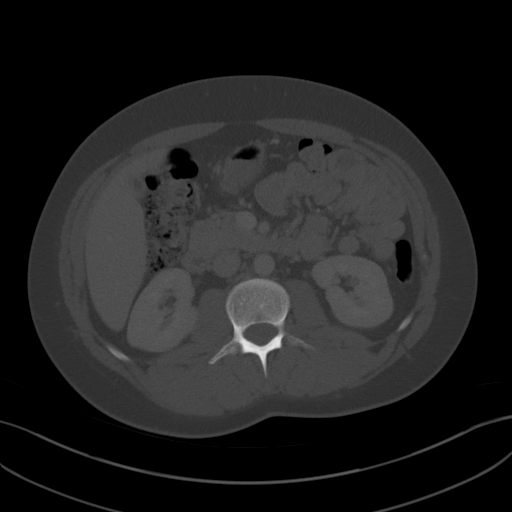
[im 70/99  soft-tissue]
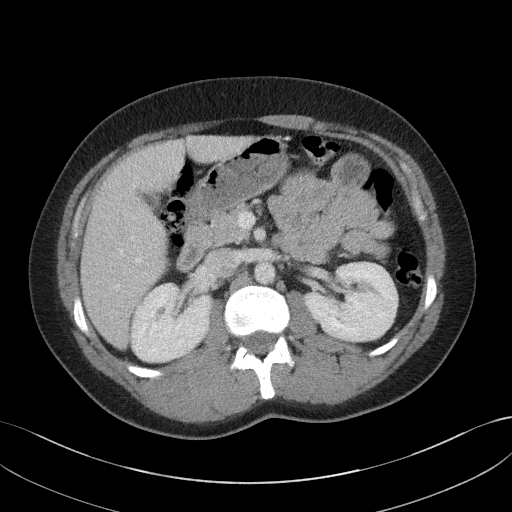
[im 78/99  soft-tissue]
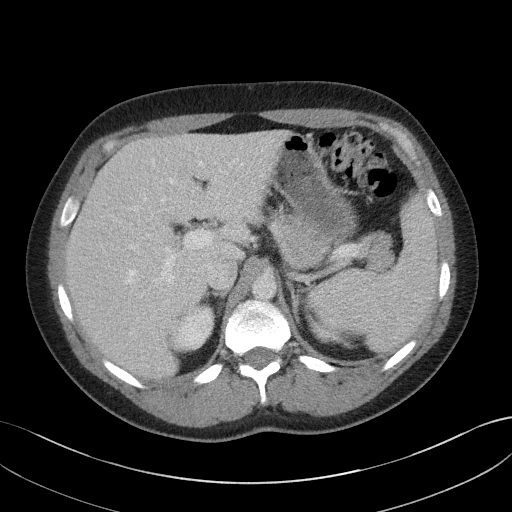
[im 86/99  soft-tissue]
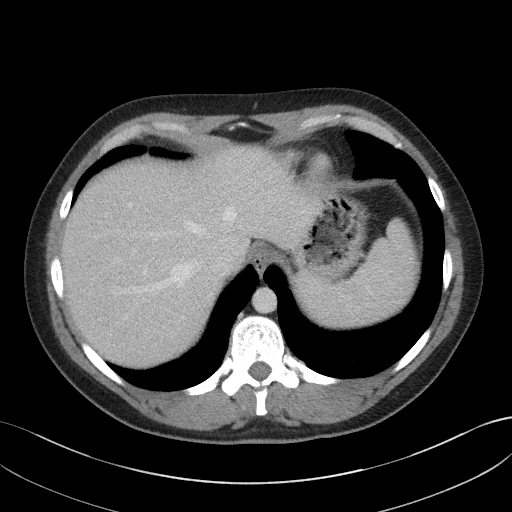
[im 94/99  soft-tissue]
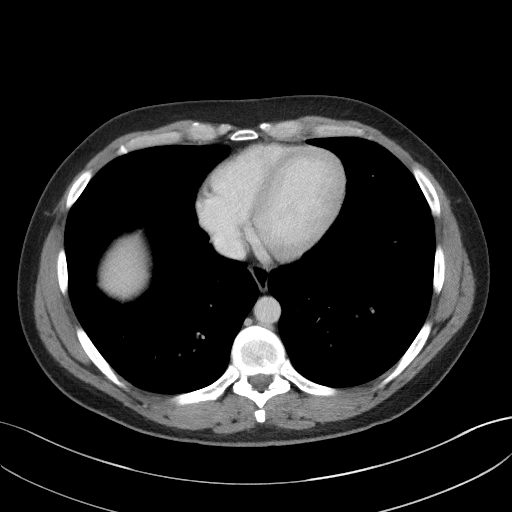

[Series 5: coronal st · coronal · 0.78mm/px · 3 of 94 slices shown]
[im 32/94  soft-tissue]
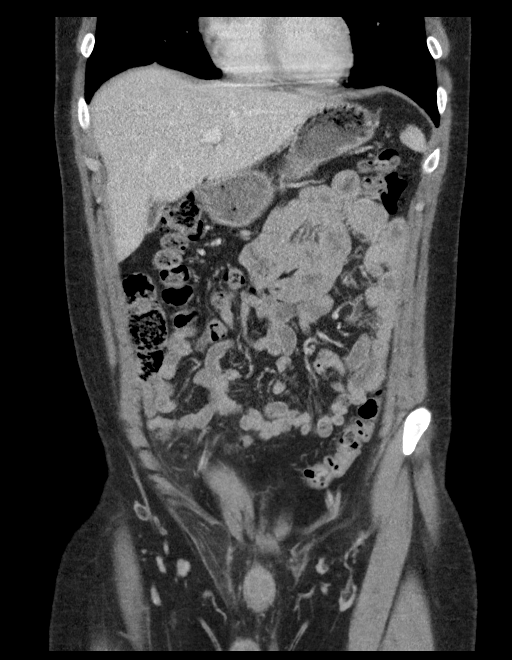
[im 42/94  soft-tissue]
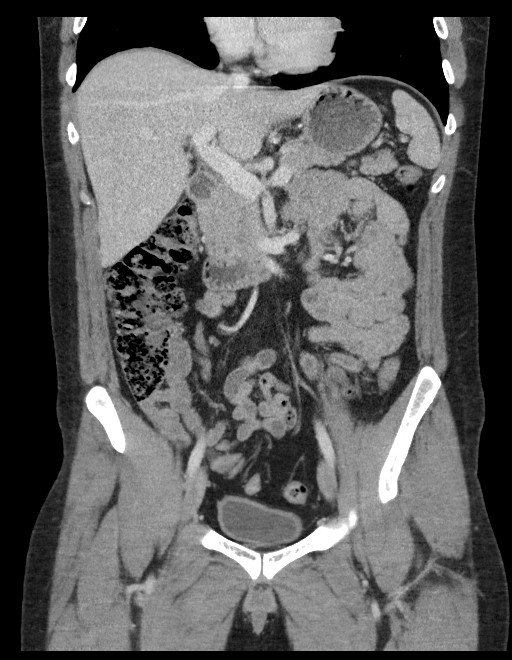
[im 52/94  soft-tissue]
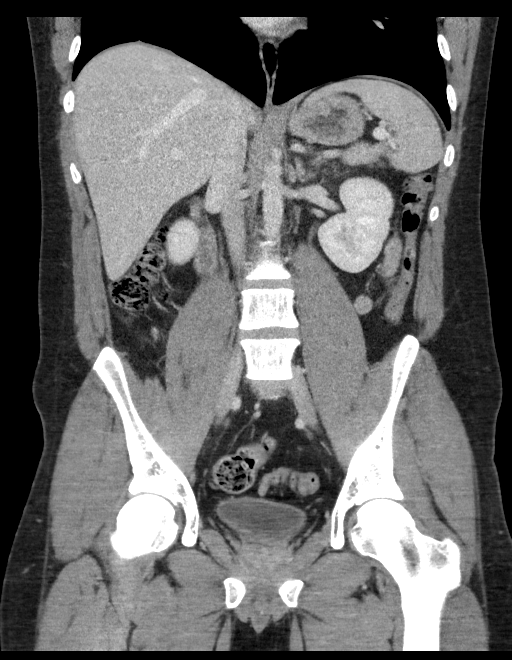

[16 of 46 positions shown; findings below may reference images not displayed]

FINDINGS: Lower chest: Minimal subsegmental atelectatic changes seen
dependently within the right lung base. Visualized lungs are
otherwise clear.

Hepatobiliary: Liver demonstrates a normal contrast enhanced
appearance. Gallbladder largely contracted without acute
abnormality. No biliary dilatation.

Pancreas: Pancreas within normal limits.

Spleen: Normal.

Adrenals/Urinary Tract: Adrenal glands are normal. Kidneys equal
size with symmetric enhancement. No nephrolithiasis, hydronephrosis,
or focal enhancing renal mass. No hydroureter. Partially distended
bladder within normal limits.

Stomach/Bowel: Stomach within normal limits. No evidence for bowel
obstruction. Normal appendix. No acute inflammatory changes seen
about the bowels.

Vascular/Lymphatic: Normal intravascular enhancement seen throughout
the intra-abdominal aorta. Mesenteric vessels patent proximally. No
adenopathy.

Reproductive: Prostate normal.

Other: Fat containing right inguinal hernia is present within the
right inguinal region (series 2, image 83). Hazy soft tissue
stranding within the herniated fat suggesting acute inflammation. No
herniated loops of bowel or other complication. Additional small fat
containing left inguinal hernia noted as well without associated
inflammation.

No free air or fluid.

Musculoskeletal: No acute osseus abnormality. No worrisome lytic or
blastic osseous lesions.
IMPRESSION: 1. Fat containing right inguinal hernia with associated mild
inflammatory fat stranding, presumably reflecting patient's source
of pain/discomfort. No herniated loops of bowel or other
complication identified.
2. No other acute intra-abdominal or pelvic process.
